# Patient Record
Sex: Female | Born: 1960 | Race: White | Hispanic: No | Marital: Married | Smoking: Current every day smoker
Health system: Southern US, Community
[De-identification: ages and names within clinical notes are randomized; demographics above are authoritative.]

## PROBLEM LIST (undated history)

## (undated) DIAGNOSIS — F419 Anxiety disorder, unspecified: Secondary | ICD-10-CM

## (undated) DIAGNOSIS — M419 Scoliosis, unspecified: Secondary | ICD-10-CM

## (undated) DIAGNOSIS — M1990 Unspecified osteoarthritis, unspecified site: Secondary | ICD-10-CM

## (undated) DIAGNOSIS — I10 Essential (primary) hypertension: Secondary | ICD-10-CM

## (undated) DIAGNOSIS — F4310 Post-traumatic stress disorder, unspecified: Secondary | ICD-10-CM

## (undated) DIAGNOSIS — C801 Malignant (primary) neoplasm, unspecified: Secondary | ICD-10-CM

## (undated) HISTORY — DX: Anxiety disorder, unspecified: F41.9

## (undated) HISTORY — PX: ABDOMINAL HYSTERECTOMY: SHX81

## (undated) HISTORY — DX: Malignant (primary) neoplasm, unspecified: C80.1

---

## 2007-07-02 HISTORY — PX: TOTAL ABDOMINAL HYSTERECTOMY W/ BILATERAL SALPINGOOPHORECTOMY: SHX83

## 2007-07-02 HISTORY — PX: MASTECTOMY: SHX3

## 2007-08-10 DIAGNOSIS — C801 Malignant (primary) neoplasm, unspecified: Secondary | ICD-10-CM

## 2007-08-10 HISTORY — DX: Malignant (primary) neoplasm, unspecified: C80.1

## 2017-12-01 ENCOUNTER — Encounter: Payer: Self-pay | Admitting: Endocrinology

## 2017-12-01 ENCOUNTER — Ambulatory Visit: Payer: Medicaid Other | Admitting: Endocrinology

## 2017-12-01 DIAGNOSIS — E0590 Thyrotoxicosis, unspecified without thyrotoxic crisis or storm: Secondary | ICD-10-CM | POA: Diagnosis not present

## 2017-12-01 NOTE — Progress Notes (Signed)
Subjective:    Patient ID: Jade King, female    DOB: 12-14-1960, 57 y.o.   MRN: [id number]  HPI Pt is referred by Levin Bacon, NP, for hyperthyroidism.  Pt reports he was dx'ed with hyperthyroidism in in early 2019.  she has never been on therapy for this.  she has never had XRT to the anterior neck, or thyroid surgery.  she has never had dedicated thyroid imaging.  she does not consume kelp or any other non-prescribed thyroid medication.  she has never been on amiodarone.  She had moderate anxiety, and assoc insomnia.  Metoprolol is for heart disease, not thyroid.   Past Medical History:  Diagnosis Date  . Anxiety   . Cancer ([place]) 08/10/2007   Breast cancer     Social History   Socioeconomic History  . Marital status: Married    Spouse name: Not on file  . Number of children: Not on file  . Years of education: Not on file  . Highest education level: Not on file  Occupational History  . Not on file  Social Needs  . Financial resource strain: Not on file  . Food insecurity:    Worry: Not on file    Inability: Not on file  . Transportation needs:    Medical: Not on file    Non-medical: Not on file  Tobacco Use  . Smoking status: Current Every Day Smoker    Types: Cigarettes  . Smokeless tobacco: Never Used  Substance and Sexual Activity  . Alcohol use: Never    Frequency: Never  . Drug use: Never  . Sexual activity: Not on file  Lifestyle  . Physical activity:    Days per week: Not on file    Minutes per session: Not on file  . Stress: Not on file  Relationships  . Social connections:    Talks on phone: Not on file    Gets together: Not on file    Attends religious service: Not on file    Active member of club or organization: Not on file    Attends meetings of clubs or organizations: Not on file    Relationship status: Not on file  . Intimate partner violence:    Fear of current or ex partner: Not on file    Emotionally abused: Not on file    Physically  abused: Not on file    Forced sexual activity: Not on file  Other Topics Concern  . Not on file  Social History Narrative  . Not on file    Current Outpatient Medications on File Prior to Visit  Medication Sig Dispense Refill  . metoprolol tartrate (LOPRESSOR) 100 MG tablet Take 100 mg by mouth 2 (two) times daily.    . traMADol (ULTRAM) 50 MG tablet Take 50 mg by mouth every 6 (six) hours as needed.     No current facility-administered medications on file prior to visit.     Allergies  Allergen Reactions  . Latex Hives  . Other Hives    Nuts  . Penicillins Hives and Shortness Of Breath    Family History  Problem Relation Age of Onset  . Thyroid nodules Mother   . Thyroid disease Mother     BP (!) 112/58 (BP Location: Left Arm, Patient Position: Sitting, Cuff Size: Normal)   Pulse 76   Ht 5\' 3"  (1.6 m)   Wt 103 lb (46.7 kg)   SpO2 98%   BMI 18.25 kg/m  Review of Systems denies weight loss, headache, hoarseness, diplopia, palpitations, sob, diarrhea, nocturia, muscle weakness, edema, depression, excessive diaphoresis, tremor, heat intolerance, easy bruising, and rhinorrhea.  She has fatigue.       Objective:   Physical Exam VS: see vs page GEN: no distress HEAD: head: no deformity.  Slight bilat temporal wasting eyes: no periorbital swelling, no proptosis external nose and ears are normal mouth: no lesion seen NECK: supple, thyroid is not enlarged, but has irreg surface.   CHEST WALL: scoliosis is noted LUNGS: clear to auscultation CV: reg rate and rhythm, no murmur ABD: abdomen is soft, nontender.  no hepatosplenomegaly.  not distended.  no hernia MUSCULOSKELETAL: muscle bulk and strength are grossly normal.  no obvious joint swelling.  gait is normal and steady EXTEMITIES: no deformity.  no edema. PULSES: no carotid bruit.   NEURO:  cn 2-12 grossly intact.   readily moves all 4's.  sensation is intact to touch on all 4's.  No tremor.   SKIN:  Normal  texture and temperature.  No rash or suspicious lesion is visible.  Not diaphoretic.  Slight terminal hair on the face NODES:  None palpable at the neck PSYCH: alert, well-oriented.  Does not appear anxious nor depressed.    TSH=0.01  I have reviewed outside records, and summarized: Pt was noted to have hyperthyroidism, and referred here.  Main problems addressed were back pain and anxiety.  TFT were done to eval anxiety.    Lab Results  Component Value Date   TSH 0.13 (L) 12/01/2017      Assessment & Plan:  Hyperthyroidism: new, uncertain etiology. Hirsutism: we discussed.  Pt wants to exclude cushing's. I told pt we'll have to address this when thyroid is better.   Patient Instructions  blood tests are requested for you today.  We'll let you know about the results. If it is high, I'll prescribe for you a pill to slow it down. If ever you have fever while taking methimazole, stop it and call us, even if the reason is obvious, because of the risk of a rare side-effect. Please come back for a follow-up appointment in 1 month.

## 2017-12-01 NOTE — Patient Instructions (Signed)
blood tests are requested for you today.  We'll let you know about the results. If it is high, I'll prescribe for you a pill to slow it down. If ever you have fever while taking methimazole, stop it and call us, even if the reason is obvious, because of the risk of a rare side-effect. Please come back for a follow-up appointment in 1 month.

## 2017-12-02 LAB — TSH: TSH: 0.13 u[IU]/mL — AB (ref 0.35–4.50)

## 2017-12-02 LAB — T4, FREE: Free T4: 0.83 ng/dL (ref 0.60–1.60)

## 2017-12-02 MED ORDER — METHIMAZOLE 10 MG PO TABS: 10.0000 mg | ORAL_TABLET | Freq: Three times a day (TID) | ORAL | 2 refills | Status: DC

## 2017-12-03 ENCOUNTER — Other Ambulatory Visit: Payer: Self-pay

## 2017-12-05 ENCOUNTER — Telehealth: Payer: Self-pay | Admitting: Endocrinology

## 2017-12-05 ENCOUNTER — Other Ambulatory Visit: Payer: Self-pay

## 2017-12-05 NOTE — Telephone Encounter (Signed)
methimazole (TAPAZOLE) 10 MG tablet   Patient has some questions about new medication that she is taking. And would like someone to give her a call back

## 2017-12-05 NOTE — Telephone Encounter (Signed)
I called patient & she was concerned that due to what time she gets up in the morning she would not be able to take medication 8 hrs apart. I advised that it does not say specifically 8 hrs between doses.

## 2017-12-12 ENCOUNTER — Telehealth: Payer: Self-pay | Admitting: Endocrinology

## 2017-12-12 ENCOUNTER — Other Ambulatory Visit: Payer: Self-pay

## 2017-12-12 MED ORDER — METHIMAZOLE 10 MG PO TABS: 10.0000 mg | ORAL_TABLET | Freq: Three times a day (TID) | ORAL | 2 refills | Status: DC

## 2017-12-12 NOTE — Telephone Encounter (Signed)
Please call in the Methimazole to [facility] in [place] with the correct quantity needs more than 30 tabs to take it 1 tab 3 times daily

## 2017-12-12 NOTE — Telephone Encounter (Signed)
I have sent 90 tablets in for patient.

## 2018-01-28 ENCOUNTER — Encounter: Payer: Self-pay | Admitting: Endocrinology

## 2018-01-28 ENCOUNTER — Ambulatory Visit: Payer: Medicaid Other | Admitting: Endocrinology

## 2018-01-28 VITALS — BP 122/80 | HR 72 | Temp 98.2°F | Ht 63.0 in | Wt 103.0 lb

## 2018-01-28 DIAGNOSIS — L680 Hirsutism: Secondary | ICD-10-CM

## 2018-01-28 DIAGNOSIS — E0590 Thyrotoxicosis, unspecified without thyrotoxic crisis or storm: Secondary | ICD-10-CM | POA: Diagnosis not present

## 2018-01-28 LAB — CORTISOL: CORTISOL PLASMA: 11.6 ug/dL

## 2018-01-28 LAB — TSH: TSH: 1.01 u[IU]/mL (ref 0.35–4.50)

## 2018-01-28 LAB — T4, FREE: FREE T4: 0.81 ng/dL (ref 0.60–1.60)

## 2018-01-28 MED ORDER — METHIMAZOLE 5 MG PO TABS: 5.0000 mg | ORAL_TABLET | Freq: Every day | ORAL | 5 refills | Status: DC

## 2018-01-28 NOTE — Progress Notes (Signed)
Subjective:    Patient ID: Jade King, female    DOB: [birth year], 57 y.o.   MRN: [id number]  HPI Pt returns for f/u of mild hyperthyroidism (dx'ed early 2019; she has never had dedicated thyroid imaging; metoprolol is for heart disease, not thyroid; she chose tapazole rx).  She takes tapazole 10 mg BID.  pt states she feels well in general, except for anxiety.  She has moderate terminal hair growth on the face, but no assoc flushing Past Medical History:  Diagnosis Date  . Anxiety   . Cancer ([place]) 08/10/2007   Breast cancer     Social History   Socioeconomic History  . Marital status: Married    Spouse name: Not on file  . Number of children: Not on file  . Years of education: Not on file  . Highest education level: Not on file  Occupational History  . Not on file  Social Needs  . Financial resource strain: Not on file  . Food insecurity:    Worry: Not on file    Inability: Not on file  . Transportation needs:    Medical: Not on file    Non-medical: Not on file  Tobacco Use  . Smoking status: Current Every Day Smoker    Types: Cigarettes  . Smokeless tobacco: Never Used  Substance and Sexual Activity  . Alcohol use: Never    Frequency: Never  . Drug use: Never  . Sexual activity: Not on file  Lifestyle  . Physical activity:    Days per week: Not on file    Minutes per session: Not on file  . Stress: Not on file  Relationships  . Social connections:    Talks on phone: Not on file    Gets together: Not on file    Attends religious service: Not on file    Active member of club or organization: Not on file    Attends meetings of clubs or organizations: Not on file    Relationship status: Not on file  . Intimate partner violence:    Fear of current or ex partner: Not on file    Emotionally abused: Not on file    Physically abused: Not on file    Forced sexual activity: Not on file  Other Topics Concern  . Not on file  Social History Narrative  . Not on  file    Current Outpatient Medications on File Prior to Visit  Medication Sig Dispense Refill  . metoprolol tartrate (LOPRESSOR) 100 MG tablet Take 100 mg by mouth 2 (two) times daily.    . RESTASIS 0.05 % ophthalmic emulsion   3  . traMADol (ULTRAM) 50 MG tablet Take 50 mg by mouth every 6 (six) hours as needed.     No current facility-administered medications on file prior to visit.     Allergies  Allergen Reactions  . Latex Hives  . Other Hives    Nuts  . Penicillins Hives and Shortness Of Breath    Family History  Problem Relation Age of Onset  . Thyroid nodules Mother   . Thyroid disease Mother     BP 122/80 (BP Location: Right Arm, Patient Position: Sitting, Cuff Size: Normal)   Pulse 72   Temp 98.2 F (36.8 C) (Oral)   Ht 5\' 3"  (1.6 m)   Wt 103 lb (46.7 kg)   SpO2 97%   BMI 18.25 kg/m   Review of Systems Denies fever and palpitations    Objective:  Physical Exam VITAL SIGNS:  See vs page GENERAL: no distress NECK: There is no palpable thyroid enlargement.  No thyroid nodule is palpable.  No palpable lymphadenopathy at the anterior neck.     Lab Results  Component Value Date   TSH 1.01 01/28/2018      Assessment & Plan:  Hyperthyroidism: due for recheck.  Tachycardia: not thyroid-related: well-controlled.  Please continue the same metoprolol.   Patient Instructions  blood tests are requested for you today.  We'll let you know about the results. Based on the result, we'll reduce the methimazole. If ever you have fever while taking methimazole, stop it and call us, even if the reason is obvious, because of the risk of a rare side-effect.  Please come back for a follow-up appointment in 2 months.

## 2018-01-28 NOTE — Patient Instructions (Addendum)
blood tests are requested for you today.  We'll let you know about the results. Based on the result, we'll reduce the methimazole. If ever you have fever while taking methimazole, stop it and call us, even if the reason is obvious, because of the risk of a rare side-effect.  Please come back for a follow-up appointment in 2 months.

## 2018-01-30 LAB — TESTOSTERONE,FREE AND TOTAL
Testosterone, Free: 0.7 pg/mL (ref 0.0–4.2)
Testosterone: 9 ng/dL (ref 3–41)

## 2018-03-31 ENCOUNTER — Ambulatory Visit: Payer: Medicaid Other | Admitting: Endocrinology

## 2018-05-05 ENCOUNTER — Encounter: Payer: Self-pay | Admitting: Endocrinology

## 2018-05-05 ENCOUNTER — Ambulatory Visit (INDEPENDENT_AMBULATORY_CARE_PROVIDER_SITE_OTHER): Payer: Medicaid Other | Admitting: Endocrinology

## 2018-05-05 VITALS — BP 124/78 | HR 74 | Ht 63.0 in | Wt 105.3 lb

## 2018-05-05 DIAGNOSIS — L680 Hirsutism: Secondary | ICD-10-CM

## 2018-05-05 DIAGNOSIS — E0590 Thyrotoxicosis, unspecified without thyrotoxic crisis or storm: Secondary | ICD-10-CM

## 2018-05-05 NOTE — Patient Instructions (Addendum)
blood tests are requested for you today.  We'll let you know about the results. If ever you have fever while taking methimazole, stop it and call us, even if the reason is obvious, because of the risk of a rare side-effect. Please come back for a follow-up appointment in 4 months.  

## 2018-05-05 NOTE — Progress Notes (Signed)
Subjective:    Patient ID: Jade King, female    DOB: 1961/01/22, 57 y.o.   MRN: [id number]  HPI Pt returns for f/u of mild hyperthyroidism (dx'ed early 2019; she has never had dedicated thyroid imaging; metoprolol is for heart disease, not thyroid; she chose tapazole rx).  She takes tapazole 10 mg BID.  pt states she feels well in general.   Past Medical History:  Diagnosis Date  . Anxiety   . Cancer ([place]) 08/10/2007   Breast cancer     Social History   Socioeconomic History  . Marital status: Married    Spouse name: Not on file  . Number of children: Not on file  . Years of education: Not on file  . Highest education level: Not on file  Occupational History  . Not on file  Social Needs  . Financial resource strain: Not on file  . Food insecurity:    Worry: Not on file    Inability: Not on file  . Transportation needs:    Medical: Not on file    Non-medical: Not on file  Tobacco Use  . Smoking status: Current Every Day Smoker    Types: Cigarettes  . Smokeless tobacco: Never Used  Substance and Sexual Activity  . Alcohol use: Never    Frequency: Never  . Drug use: Never  . Sexual activity: Not on file  Lifestyle  . Physical activity:    Days per week: Not on file    Minutes per session: Not on file  . Stress: Not on file  Relationships  . Social connections:    Talks on phone: Not on file    Gets together: Not on file    Attends religious service: Not on file    Active member of club or organization: Not on file    Attends meetings of clubs or organizations: Not on file    Relationship status: Not on file  . Intimate partner violence:    Fear of current or ex partner: Not on file    Emotionally abused: Not on file    Physically abused: Not on file    Forced sexual activity: Not on file  Other Topics Concern  . Not on file  Social History Narrative  . Not on file    Current Outpatient Medications on File Prior to Visit  Medication Sig Dispense  Refill  . methimazole (TAPAZOLE) 5 MG tablet Take 1 tablet (5 mg total) by mouth daily. 30 tablet 5  . metoprolol tartrate (LOPRESSOR) 100 MG tablet Take 100 mg by mouth 2 (two) times daily.    . RESTASIS 0.05 % ophthalmic emulsion   3  . traMADol (ULTRAM) 50 MG tablet Take 50 mg by mouth every 6 (six) hours as needed.     No current facility-administered medications on file prior to visit.     Allergies  Allergen Reactions  . Latex Hives  . Other Hives    Nuts  . Penicillins Hives and Shortness Of Breath    Family History  Problem Relation Age of Onset  . Thyroid nodules Mother   . Thyroid disease Mother     BP 124/78 (BP Location: Right Arm, Patient Position: Sitting, Cuff Size: Normal)   Pulse 74   Ht 5\' 3"  (1.6 m)   Wt 105 lb 4.8 oz (47.8 kg)   SpO2 96%   BMI 18.65 kg/m    Review of Systems Denies fever.  Facial hair persists.  Objective:   Physical Exam VITAL SIGNS:  See vs page GENERAL: no distress NECK: There is no palpable thyroid enlargement.  No thyroid nodule is palpable.  No palpable lymphadenopathy at the anterior neck.    Lab Results  Component Value Date   TSH 2.36 05/05/2018       Assessment & Plan:  Hyperthyroidism: well-controlled. Hirsutism, mild.  I told pt no w/u is needed  Patient Instructions  blood tests are requested for you today.  We'll let you know about the results. If ever you have fever while taking methimazole, stop it and call us, even if the reason is obvious, because of the risk of a rare side-effect.  Please come back for a follow-up appointment in 4 months.

## 2018-05-06 LAB — DHEA-SULFATE: DHEA-SO4: 58 ug/dL (ref 8–188)

## 2018-05-06 LAB — T4, FREE: FREE T4: 0.73 ng/dL (ref 0.60–1.60)

## 2018-05-06 LAB — TSH: TSH: 2.36 u[IU]/mL (ref 0.35–4.50)

## 2018-07-12 ENCOUNTER — Other Ambulatory Visit: Payer: Self-pay | Admitting: Endocrinology

## 2018-09-01 ENCOUNTER — Ambulatory Visit: Payer: Medicaid Other | Admitting: Endocrinology

## 2018-09-01 ENCOUNTER — Encounter: Payer: Self-pay | Admitting: Endocrinology

## 2018-09-01 ENCOUNTER — Other Ambulatory Visit: Payer: Self-pay

## 2018-09-01 VITALS — BP 140/90 | HR 68 | Ht 63.0 in | Wt 109.0 lb

## 2018-09-01 DIAGNOSIS — E0590 Thyrotoxicosis, unspecified without thyrotoxic crisis or storm: Secondary | ICD-10-CM

## 2018-09-01 DIAGNOSIS — I10 Essential (primary) hypertension: Secondary | ICD-10-CM | POA: Diagnosis not present

## 2018-09-01 NOTE — Patient Instructions (Addendum)
Your blood pressure is high today.  Please see your primary care provider soon, to have it rechecked. blood tests are requested for you today.  We'll let you know about the results. If ever you have fever while taking methimazole, stop it and call us, even if the reason is obvious, because of the risk of a rare side-effect.  Please come back for a follow-up appointment in 6 months.

## 2018-09-01 NOTE — Progress Notes (Signed)
Subjective:    Patient ID: Jade King, female    DOB: 1960/07/06, 58 y.o.   MRN: [id number]  HPI Pt returns for f/u of mild hyperthyroidism (dx'ed early 2019; she has never had dedicated thyroid imaging; metoprolol is for heart disease, not thyroid; she chose tapazole rx).  pt states she feels well in general, except for anxiety.    Past Medical History:  Diagnosis Date  . Anxiety   . Cancer ([place]) 08/10/2007   Breast cancer     Social History   Socioeconomic History  . Marital status: Married    Spouse name: Not on file  . Number of children: Not on file  . Years of education: Not on file  . Highest education level: Not on file  Occupational History  . Not on file  Social Needs  . Financial resource strain: Not on file  . Food insecurity:    Worry: Not on file    Inability: Not on file  . Transportation needs:    Medical: Not on file    Non-medical: Not on file  Tobacco Use  . Smoking status: Current Every Day Smoker    Types: Cigarettes  . Smokeless tobacco: Never Used  Substance and Sexual Activity  . Alcohol use: Never    Frequency: Never  . Drug use: Never  . Sexual activity: Not on file  Lifestyle  . Physical activity:    Days per week: Not on file    Minutes per session: Not on file  . Stress: Not on file  Relationships  . Social connections:    Talks on phone: Not on file    Gets together: Not on file    Attends religious service: Not on file    Active member of club or organization: Not on file    Attends meetings of clubs or organizations: Not on file    Relationship status: Not on file  . Intimate partner violence:    Fear of current or ex partner: Not on file    Emotionally abused: Not on file    Physically abused: Not on file    Forced sexual activity: Not on file  Other Topics Concern  . Not on file  Social History Narrative  . Not on file    Current Outpatient Medications on File Prior to Visit  Medication Sig Dispense Refill  .  methimazole (TAPAZOLE) 5 MG tablet TAKE 1 TABLET(5 MG) BY MOUTH DAILY 30 tablet 5  . metoprolol tartrate (LOPRESSOR) 100 MG tablet Take 100 mg by mouth 2 (two) times daily.    . RESTASIS 0.05 % ophthalmic emulsion   3  . traMADol (ULTRAM) 50 MG tablet Take 50 mg by mouth every 6 (six) hours as needed.     No current facility-administered medications on file prior to visit.     Allergies  Allergen Reactions  . Latex Hives  . Other Hives    Nuts  . Penicillins Hives and Shortness Of Breath    Family History  Problem Relation Age of Onset  . Thyroid nodules Mother   . Thyroid disease Mother     BP 140/90   Pulse 68   Ht 5\' 3"  (1.6 m)   Wt 109 lb (49.4 kg)   SpO2 98%   BMI 19.31 kg/m    Review of Systems Denies fever.     Objective:   Physical Exam VITAL SIGNS:  See vs page GENERAL: no distress NECK: There is no palpable thyroid enlargement.  No thyroid nodule is palpable.  No palpable lymphadenopathy at the anterior neck.   Lab Results  Component Value Date   TSH 2.34 09/01/2018      Assessment & Plan:  HTN: is noted today Hyperthyroidism: well-controlled. Please continue the same medication.   Patient Instructions  Your blood pressure is high today.  Please see your primary care provider soon, to have it rechecked. blood tests are requested for you today.  We'll let you know about the results. If ever you have fever while taking methimazole, stop it and call us, even if the reason is obvious, because of the risk of a rare side-effect.  Please come back for a follow-up appointment in 6 months.

## 2018-09-02 LAB — T4, FREE: FREE T4: 0.73 ng/dL (ref 0.60–1.60)

## 2018-09-02 LAB — TSH: TSH: 2.34 u[IU]/mL (ref 0.35–4.50)

## 2019-01-04 ENCOUNTER — Telehealth: Payer: Self-pay | Admitting: Endocrinology

## 2019-01-04 ENCOUNTER — Other Ambulatory Visit: Payer: Self-pay

## 2019-01-04 MED ORDER — METHIMAZOLE 5 MG PO TABS: ORAL_TABLET | ORAL | 5 refills | Status: AC

## 2019-01-04 NOTE — Telephone Encounter (Signed)
MEDICATION: methimazole (TAPAZOLE) 5 MG tablet  PHARMACY:  [id number]  IS THIS A 90 DAY SUPPLY :   IS PATIENT OUT OF MEDICATION:   IF NOT; HOW MUCH IS LEFT:   LAST APPOINTMENT DATE: @3 /08/2018  NEXT APPOINTMENT DATE:@9 /03/2019  DO WE HAVE YOUR PERMISSION TO LEAVE A DETAILED MESSAGE:  OTHER COMMENTS:    **Let patient know to contact pharmacy at the end of the day to make sure medication is ready. **  ** Please notify patient to allow 48-72 hours to process**  **Encourage patient to contact the pharmacy for refills or they can request refills through [facility]**

## 2019-01-04 NOTE — Telephone Encounter (Signed)
methimazole (TAPAZOLE) 5 MG tablet 30 tablet 5 01/04/2019    Sig: TAKE 1 TABLET(5 MG) BY MOUTH DAILY   Sent to pharmacy as: methimazole (TAPAZOLE) 5 MG tablet   E-Prescribing Status: Receipt confirmed by pharmacy (01/04/2019 12:43 PM EDT)

## 2019-03-10 ENCOUNTER — Ambulatory Visit: Payer: Medicaid Other | Admitting: Endocrinology

## 2019-03-17 ENCOUNTER — Telehealth: Payer: Self-pay | Admitting: Endocrinology

## 2019-03-17 NOTE — Telephone Encounter (Signed)
Recv'd incoming medical records from Dr. Izora Ribas [facility]. Forwarded 35 pages to Dr. Renato Shin 9/16/20fbg.

## 2019-03-19 ENCOUNTER — Telehealth: Payer: Self-pay | Admitting: Endocrinology

## 2019-03-19 NOTE — Telephone Encounter (Signed)
please contact patient: F/u ov is due 

## 2019-03-19 NOTE — Telephone Encounter (Signed)
FYI

## 2019-03-19 NOTE — Telephone Encounter (Signed)
Please refer to Dr. Ellison's request below 

## 2019-03-19 NOTE — Telephone Encounter (Signed)
Patient states she will not be seeing Dr. Loanne Drilling anymore. Patient is seeing a new Endocrinologist.

## 2019-03-19 NOTE — Telephone Encounter (Signed)
LMTCB to schedule appointment °

## 2020-02-28 ENCOUNTER — Ambulatory Visit
Admission: EM | Admit: 2020-02-28 | Discharge: 2020-02-28 | Disposition: A | Discharge status: Home / Self Care | Payer: Medicaid Other | Attending: Emergency Medicine | Admitting: Emergency Medicine

## 2020-02-28 ENCOUNTER — Other Ambulatory Visit: Payer: Self-pay

## 2020-02-28 DIAGNOSIS — R21 Rash and other nonspecific skin eruption: Secondary | ICD-10-CM | POA: Diagnosis not present

## 2020-02-28 HISTORY — DX: Post-traumatic stress disorder, unspecified: F43.10

## 2020-02-28 HISTORY — DX: Essential (primary) hypertension: I10

## 2020-02-28 HISTORY — DX: Unspecified osteoarthritis, unspecified site: M19.90

## 2020-02-28 HISTORY — DX: Scoliosis, unspecified: M41.9

## 2020-02-28 MED ORDER — TRIAMCINOLONE ACETONIDE 0.1 % EX CREA: 1.0000 "application " | TOPICAL_CREAM | Freq: Two times a day (BID) | CUTANEOUS | 0 refills | Status: AC

## 2020-02-28 MED ORDER — VALACYCLOVIR HCL 1 G PO TABS: 1000.0000 mg | ORAL_TABLET | Freq: Three times a day (TID) | ORAL | 0 refills | Status: AC

## 2020-02-28 NOTE — ED Triage Notes (Signed)
Pt c/o an itchy rash to back, lt leg, and chest with nausea and cough x2wks. States had a neg covid test after sx's started.

## 2020-02-28 NOTE — Discharge Instructions (Signed)
Begin Valtrex 3 times a day for 10 days Triamcinolone to areas of itching/inflammation twice daily May use daily cetirizine/Zyrtec or loratadine/Claritin for the help with itching Continue to monitor rash and follow-up if not improving or worsening

## 2020-02-28 NOTE — ED Provider Notes (Signed)
[facility]    CSN: [id number] Arrival date & time: 02/28/20  1302      History   Chief Complaint Chief Complaint  Patient presents with  . Rash    HPI Jade King is a 59 y.o. female history of hypertension, breast cancer, scoliosis, presenting today for evaluation of a rash.  Patient reports over the past 2 to 3 weeks she has had a rash to her back.  Most prominently on the right side.  Has had associated sensitivity itching burning and discomfort with rash.  Over the past couple days she has began to feel similar symptoms to her abdomen as well as lower legs bilaterally.  She is concerned about possible shingles.  Denies any new foods, soaps, lotions, detergents, medicines, hygiene products.  HPI  Past Medical History:  Diagnosis Date  . Anxiety   . Arthritis   . Cancer ([place]) 08/10/2007   Breast cancer  . Hypertension   . PTSD (post-traumatic stress disorder)   . Scoliosis     Patient Active Problem List   Diagnosis Date Noted  . Hirsutism 01/28/2018  . Hyperthyroidism 12/01/2017    Past Surgical History:  Procedure Laterality Date  . ABDOMINAL HYSTERECTOMY    . MASTECTOMY  2009  . TOTAL ABDOMINAL HYSTERECTOMY W/ BILATERAL SALPINGOOPHORECTOMY  2009    OB History   No obstetric history on file.      Home Medications    Prior to Admission medications   Medication Sig Start Date End Date Taking? Authorizing Provider  methimazole (TAPAZOLE) 5 MG tablet TAKE 1 TABLET(5 MG) BY MOUTH DAILY 01/04/19   Renato Shin, MD  metoprolol tartrate (LOPRESSOR) 100 MG tablet Take 100 mg by mouth 2 (two) times daily.    [provider]  RESTASIS 0.05 % ophthalmic emulsion  11/01/17   [provider]  triamcinolone cream (KENALOG) 0.1 % Apply 1 application topically 2 (two) times daily. 02/28/20   Brexton Sofia C, PA-C  valACYclovir (VALTREX) 1000 MG tablet Take 1 tablet (1,000 mg total) by mouth 3 (three) times daily for 10 days. 02/28/20  03/09/20  Lonna Rabold, Elesa Hacker, PA-C    Family History Family History  Problem Relation Age of Onset  . Thyroid nodules Mother   . Thyroid disease Mother     Social History Social History   Tobacco Use  . Smoking status: Current Every Day Smoker    Types: Cigarettes  . Smokeless tobacco: Never Used  Substance Use Topics  . Alcohol use: Never  . Drug use: Never     Allergies   Latex, Other, and Penicillins   Review of Systems Review of Systems  Constitutional: Negative for fatigue and fever.  HENT: Negative for mouth sores.   Eyes: Negative for visual disturbance.  Respiratory: Negative for shortness of breath.   Cardiovascular: Negative for chest pain.  Gastrointestinal: Negative for abdominal pain, nausea and vomiting.  Musculoskeletal: Negative for arthralgias and joint swelling.  Skin: Positive for color change and rash. Negative for wound.  Neurological: Negative for dizziness, weakness, light-headedness and headaches.     Physical Exam Triage Vital Signs ED Triage Vitals [02/28/20 1447]  Enc Vitals Group     BP (!) 160/81     Pulse Rate 72     Resp 18     Temp 98.5 F (36.9 C)     Temp Source Oral     SpO2 97 %     Weight      Height  Head Circumference      Peak Flow      Pain Score 10     Pain Loc      Pain Edu?      Excl. in [place]?    No data found.  Updated Vital Signs BP (!) 160/81 (BP Location: Left Arm)   Pulse 72   Temp 98.5 F (36.9 C) (Oral)   Resp 18   SpO2 97%   Visual Acuity Right Eye Distance:   Left Eye Distance:   Bilateral Distance:    Right Eye Near:   Left Eye Near:    Bilateral Near:     Physical Exam Vitals and nursing note reviewed.  Constitutional:      Appearance: She is well-developed.     Comments: No acute distress  HENT:     Head: Normocephalic and atraumatic.     Nose: Nose normal.     Mouth/Throat:     Comments: No lesions noted on oral mucosa Eyes:     Conjunctiva/sclera: Conjunctivae normal.    Cardiovascular:     Rate and Rhythm: Normal rate.  Pulmonary:     Effort: Pulmonary effort is normal. No respiratory distress.  Abdominal:     General: There is no distension.  Musculoskeletal:        General: Normal range of motion.     Cervical back: Neck supple.  Skin:    General: Skin is warm and dry.     Comments: Back with area of clustered hyperpigmented and circular areas most prominently noted centrally over lower thoracic spine spreading towards right lower thoracic area, no lesions noted rocking to abdomen or spreading towards left  Left leg with small erythematous nonblanchable punctate lesions, approximately 3, noted to medial aspect of left knee  No other rash noted  Neurological:     Mental Status: She is alert and oriented to person, place, and time.      UC Treatments / Results  Labs (all labs ordered are listed, but only abnormal results are displayed) Labs Reviewed - No data to display  EKG   Radiology No results found.  Procedures Procedures (including critical care time)  Medications Ordered in UC Medications - No data to display  Initial Impression / Assessment and Plan / UC Course  I have reviewed the triage vital signs and the nursing notes.  Pertinent labs & imaging results that were available during my care of the patient were reviewed by me and considered in my medical decision making (see chart for details).     Healing rash on back is most suggestive of shingles given description as well as appearance on back, discussed with patient lesions appear healed, possible remaining neuralgia secondary to shingles.  Also concerned about spreading given she has felt some itching and burning sensations in her legs and abdomen.  Did go ahead and after provide course of Valtrex, but encouraged to continue to monitor rash.  Lesions on leg less suggestive of shingles.  Triamcinolone topically as needed.  May use oral antihistamines for further itch  relief.  Offered hydroxyzine, patient declined as she reports change in 92-month old dog which requires assistance.  Discussed strict return precautions. Patient verbalized understanding and is agreeable with plan.  Final Clinical Impressions(s) / UC Diagnoses   Final diagnoses:  Rash and nonspecific skin eruption     Discharge Instructions     Begin Valtrex 3 times a day for 10 days Triamcinolone to areas of itching/inflammation twice daily May  use daily cetirizine/Zyrtec or loratadine/Claritin for the help with itching Continue to monitor rash and follow-up if not improving or worsening    ED Prescriptions    Medication Sig Dispense Auth. Provider   valACYclovir (VALTREX) 1000 MG tablet Take 1 tablet (1,000 mg total) by mouth 3 (three) times daily for 10 days. 30 tablet Keysean Savino C, PA-C   triamcinolone cream (KENALOG) 0.1 % Apply 1 application topically 2 (two) times daily. 45 g Leily Capek, [place] C, PA-C     PDMP not reviewed this encounter.   Joneen Caraway [place] C, [place] 02/29/20 0740

## 2020-08-15 ENCOUNTER — Ambulatory Visit
Admission: EM | Admit: 2020-08-15 | Discharge: 2020-08-15 | Disposition: A | Discharge status: Home / Self Care | Payer: Medicaid Other | Attending: Emergency Medicine | Admitting: Emergency Medicine

## 2020-08-15 ENCOUNTER — Other Ambulatory Visit: Payer: Self-pay

## 2020-08-15 ENCOUNTER — Encounter: Payer: Self-pay | Admitting: Emergency Medicine

## 2020-08-15 DIAGNOSIS — R5383 Other fatigue: Secondary | ICD-10-CM | POA: Diagnosis not present

## 2020-08-15 DIAGNOSIS — R52 Pain, unspecified: Secondary | ICD-10-CM | POA: Diagnosis not present

## 2020-08-15 DIAGNOSIS — R21 Rash and other nonspecific skin eruption: Secondary | ICD-10-CM

## 2020-08-15 MED ORDER — VALACYCLOVIR HCL 1 G PO TABS: 1000.0000 mg | ORAL_TABLET | Freq: Three times a day (TID) | ORAL | 0 refills | Status: AC

## 2020-08-15 NOTE — Discharge Instructions (Addendum)
COVID test pending Begin valtrex, monitor for development of more prominent shingles rash in the next few days Rest and fluids Follow up if symptoms not improving/changing/worsening

## 2020-08-15 NOTE — ED Triage Notes (Signed)
Pt here for bilateral rash to chest area starting today with some "different aches" than normal

## 2020-08-16 NOTE — ED Provider Notes (Signed)
[facility]    CSN: [id number] Arrival date & time: 08/15/20  1808      History   Chief Complaint Chief Complaint  Patient presents with  . Rash    HPI Jade King is a 60 y.o. female history of breast cancer, scoliosis, hypertension, presenting today for evaluation of possible rash.  Patient reports over the past 24 hours she has developed fatigue aches, intermittent low-grade fevers as well as concern for possible rash to trunk.  She felt that her back and sides have felt slightly different than normal and is concerned possible rash popping up.  She reports that her daughter who lives at home with her recently was diagnosed with shingles and is concerned about this being the cause.  She does describe a burning/tingling sensation that began over the past day.  She denies any URI symptoms or cough.  Denies any nausea or vomiting.  HPI  Past Medical History:  Diagnosis Date  . Anxiety   . Arthritis   . Cancer ([place]) 08/10/2007   Breast cancer  . Hypertension   . PTSD (post-traumatic stress disorder)   . Scoliosis     Patient Active Problem List   Diagnosis Date Noted  . Hirsutism 01/28/2018  . Hyperthyroidism 12/01/2017    Past Surgical History:  Procedure Laterality Date  . ABDOMINAL HYSTERECTOMY    . MASTECTOMY  2009  . TOTAL ABDOMINAL HYSTERECTOMY W/ BILATERAL SALPINGOOPHORECTOMY  2009    OB History   No obstetric history on file.      Home Medications    Prior to Admission medications   Medication Sig Start Date End Date Taking? Authorizing Provider  valACYclovir (VALTREX) 1000 MG tablet Take 1 tablet (1,000 mg total) by mouth 3 (three) times daily for 14 days. 08/15/20 08/29/20 Yes Lijah Bourque C, PA-C  methimazole (TAPAZOLE) 5 MG tablet TAKE 1 TABLET(5 MG) BY MOUTH DAILY 01/04/19   Renato Shin, MD  metoprolol tartrate (LOPRESSOR) 100 MG tablet Take 100 mg by mouth 2 (two) times daily.    [provider]  RESTASIS 0.05 %  ophthalmic emulsion  11/01/17   [provider]  triamcinolone cream (KENALOG) 0.1 % Apply 1 application topically 2 (two) times daily. 02/28/20   Aquilla Shambley, Elesa Hacker, PA-C    Family History Family History  Problem Relation Age of Onset  . Thyroid nodules Mother   . Thyroid disease Mother     Social History Social History   Tobacco Use  . Smoking status: Current Every Day Smoker    Types: Cigarettes  . Smokeless tobacco: Never Used  Substance Use Topics  . Alcohol use: Never  . Drug use: Never     Allergies   Latex, Other, and Penicillins   Review of Systems Review of Systems  Constitutional: Positive for fatigue. Negative for fever.  HENT: Negative for congestion and sore throat.   Eyes: Negative for visual disturbance.  Respiratory: Negative for cough and shortness of breath.   Cardiovascular: Negative for chest pain.  Gastrointestinal: Negative for abdominal pain, nausea and vomiting.  Musculoskeletal: Positive for arthralgias. Negative for joint swelling.  Skin: Positive for color change and rash. Negative for wound.  Neurological: Negative for dizziness, weakness, light-headedness and headaches.     Physical Exam Triage Vital Signs ED Triage Vitals [08/15/20 1955]  Enc Vitals Group     BP (!) 159/99     Pulse Rate 72     Resp 18     Temp 98.2 F (  36.8 C)     Temp Source Oral     SpO2 98 %     Weight      Height      Head Circumference      Peak Flow      Pain Score 8     Pain Loc      Pain Edu?      Excl. in [place]?    No data found.  Updated Vital Signs BP (!) 159/99 (BP Location: Right Arm)   Pulse 72   Temp 98.2 F (36.8 C) (Oral)   Resp 18   SpO2 98%   Visual Acuity Right Eye Distance:   Left Eye Distance:   Bilateral Distance:    Right Eye Near:   Left Eye Near:    Bilateral Near:     Physical Exam Vitals and nursing note reviewed.  Constitutional:      Appearance: She is well-developed and well-nourished.     Comments: No  acute distress  HENT:     Head: Normocephalic and atraumatic.     Ears:     Comments: Bilateral ears without tenderness to palpation of external auricle, tragus and mastoid, EAC's without erythema or swelling, TM's with good bony landmarks and cone of light. Non erythematous.     Nose: Nose normal.     Mouth/Throat:     Comments: Oral mucosa pink and moist, no tonsillar enlargement or exudate. Posterior pharynx patent and nonerythematous, no uvula deviation or swelling. Normal phonation. Eyes:     Conjunctiva/sclera: Conjunctivae normal.  Cardiovascular:     Rate and Rhythm: Normal rate and regular rhythm.  Pulmonary:     Effort: Pulmonary effort is normal. No respiratory distress.     Comments: Breathing comfortably at rest, CTABL, no wheezing, rales or other adventitious sounds auscultated  Abdominal:     General: There is no distension.  Musculoskeletal:        General: Normal range of motion.     Cervical back: Neck supple.  Skin:    General: Skin is warm and dry.     Comments: Trunk with multiple hyperpigmented and very faintly erythematous lesions, no vesicular, raised or significantly prominent lesions noted to chest, abdomen or back  Neurological:     Mental Status: She is alert and oriented to person, place, and time.  Psychiatric:        Mood and Affect: Mood and affect normal.      UC Treatments / Results  Labs (all labs ordered are listed, but only abnormal results are displayed) Labs Reviewed  NOVEL CORONAVIRUS, NAA    EKG   Radiology No results found.  Procedures Procedures (including critical care time)  Medications Ordered in UC Medications - No data to display  Initial Impression / Assessment and Plan / UC Course  I have reviewed the triage vital signs and the nursing notes.  Pertinent labs & imaging results that were available during my care of the patient were reviewed by me and considered in my medical decision making (see chart for  details).     Covid screening pending given patient's onset of fatigue aches and subjective fevers.  No obvious rash noted, but given patient's exposure at home and description of possible prodrome of shingles opting to go ahead and initiate on Valtrex and continuing to monitor.  Discussed strict return precautions. Patient verbalized understanding and is agreeable with plan.  Final Clinical Impressions(s) / UC Diagnoses   Final diagnoses:  Rash and  nonspecific skin eruption  Generalized body aches  Fatigue, unspecified type     Discharge Instructions     COVID test pending Begin valtrex, monitor for development of more prominent shingles rash in the next few days Rest and fluids Follow up if symptoms not improving/changing/worsening   ED Prescriptions    Medication Sig Dispense Auth. Provider   valACYclovir (VALTREX) 1000 MG tablet Take 1 tablet (1,000 mg total) by mouth 3 (three) times daily for 14 days. 42 tablet Sawyer Mentzer, [place] C, PA-C     PDMP not reviewed this encounter.   Janith Lima, [place] 08/16/20 [phone]

## 2020-08-17 LAB — NOVEL CORONAVIRUS, NAA: SARS-CoV-2, NAA: NOT DETECTED

## 2020-08-17 LAB — SARS-COV-2, NAA 2 DAY TAT

## 2020-10-05 ENCOUNTER — Other Ambulatory Visit: Payer: Self-pay

## 2020-10-05 ENCOUNTER — Ambulatory Visit (INDEPENDENT_AMBULATORY_CARE_PROVIDER_SITE_OTHER): Payer: Medicaid Other

## 2020-10-05 ENCOUNTER — Ambulatory Visit
Admission: EM | Admit: 2020-10-05 | Discharge: 2020-10-05 | Disposition: A | Discharge status: Home / Self Care | Payer: Medicaid Other | Attending: Emergency Medicine | Admitting: Emergency Medicine

## 2020-10-05 DIAGNOSIS — M818 Other osteoporosis without current pathological fracture: Secondary | ICD-10-CM | POA: Diagnosis not present

## 2020-10-05 DIAGNOSIS — M545 Low back pain, unspecified: Secondary | ICD-10-CM

## 2020-10-05 DIAGNOSIS — M5442 Lumbago with sciatica, left side: Secondary | ICD-10-CM

## 2020-10-05 MED ORDER — PREDNISONE 20 MG PO TABS: 20.0000 mg | ORAL_TABLET | Freq: Every day | ORAL | 0 refills | Status: AC

## 2020-10-05 MED ORDER — KETOROLAC TROMETHAMINE 30 MG/ML IJ SOLN
15.0000 mg | Freq: Once | INTRAMUSCULAR | Status: AC
  Administered 2020-10-05: 15 mg via INTRAMUSCULAR

## 2020-10-05 NOTE — ED Triage Notes (Signed)
Pt c/o lower back pain radiating lt leg x2 days, denies know injury. Requesting x-rays. C/o lt leg numbness. Denies loss of bowel/bladder.

## 2020-10-05 NOTE — Discharge Instructions (Addendum)
We gave you 15 mg of Toradol Begin prednisone 20 mg daily for 1 week with food in the morning Please follow-up with primary care/spine doctor as you are able Please follow-up if any symptoms worsening/changing

## 2020-10-05 NOTE — ED Provider Notes (Signed)
[facility]    CSN: [id number] Arrival date & time: 10/05/20  1924      History   Chief Complaint Chief Complaint  Patient presents with  . Back Pain    HPI Jade King is a 60 y.o. female history of scoliosis, osteoporosis, degenerative disc disease, hypertension, breast cancer presenting today for evaluation of back pain.  Reports left lower back pain for approximately 1 week.  Denies any specific injury or trauma, but reports significant history of back problems.  Concerned about possible compression fracture.  Has had a tightness around proximal thigh and numbness extending into left leg lower leg and foot.  Denies any saddle anesthesia.  Denies loss of bowel/bladder.   HPI  Past Medical History:  Diagnosis Date  . Anxiety   . Arthritis   . Cancer ([place]) 08/10/2007   Breast cancer  . Hypertension   . PTSD (post-traumatic stress disorder)   . Scoliosis     Patient Active Problem List   Diagnosis Date Noted  . Hirsutism 01/28/2018  . Hyperthyroidism 12/01/2017    Past Surgical History:  Procedure Laterality Date  . ABDOMINAL HYSTERECTOMY    . MASTECTOMY  2009  . TOTAL ABDOMINAL HYSTERECTOMY W/ BILATERAL SALPINGOOPHORECTOMY  2009    OB History   No obstetric history on file.      Home Medications    Prior to Admission medications   Medication Sig Start Date End Date Taking? Authorizing Provider  predniSONE (DELTASONE) 20 MG tablet Take 1 tablet (20 mg total) by mouth daily with breakfast for 7 days. 10/05/20 10/12/20 Yes Kyndra Condron C, PA-C  methimazole (TAPAZOLE) 5 MG tablet TAKE 1 TABLET(5 MG) BY MOUTH DAILY 01/04/19   Renato Shin, MD  metoprolol tartrate (LOPRESSOR) 100 MG tablet Take 100 mg by mouth 2 (two) times daily.    [provider]  RESTASIS 0.05 % ophthalmic emulsion  11/01/17   [provider]  triamcinolone cream (KENALOG) 0.1 % Apply 1 application topically 2 (two) times daily. 02/28/20   Malakhi Markwood, Elesa Hacker, PA-C     Family History Family History  Problem Relation Age of Onset  . Thyroid nodules Mother   . Thyroid disease Mother     Social History Social History   Tobacco Use  . Smoking status: Current Every Day Smoker    Types: Cigarettes  . Smokeless tobacco: Never Used  Substance Use Topics  . Alcohol use: Never  . Drug use: Never     Allergies   Latex, Other, and Penicillins   Review of Systems Review of Systems  Constitutional: Negative for fatigue and fever.  Eyes: Negative for visual disturbance.  Respiratory: Negative for shortness of breath.   Cardiovascular: Negative for chest pain.  Gastrointestinal: Negative for abdominal pain, nausea and vomiting.  Musculoskeletal: Positive for back pain and myalgias. Negative for arthralgias and joint swelling.  Skin: Negative for color change, rash and wound.  Neurological: Negative for dizziness, weakness, light-headedness and headaches.     Physical Exam Triage Vital Signs ED Triage Vitals  Enc Vitals Group     BP 10/05/20 1933 (!) 148/89     Pulse Rate 10/05/20 1933 78     Resp 10/05/20 1933 20     Temp 10/05/20 1933 98.6 F (37 C)     Temp Source 10/05/20 1933 Oral     SpO2 10/05/20 1933 96 %     Weight --      Height --  Head Circumference --      Peak Flow --      Pain Score 10/05/20 1937 10     Pain Loc --      Pain Edu? --      Excl. in [place]? --    No data found.  Updated Vital Signs BP (!) 148/89 (BP Location: Left Arm)   Pulse 78   Temp 98.6 F (37 C) (Oral)   Resp 20   SpO2 96%   Visual Acuity Right Eye Distance:   Left Eye Distance:   Bilateral Distance:    Right Eye Near:   Left Eye Near:    Bilateral Near:     Physical Exam Vitals and nursing note reviewed.  Constitutional:      Appearance: She is well-developed.     Comments: No acute distress  HENT:     Head: Normocephalic and atraumatic.     Nose: Nose normal.  Eyes:     Conjunctiva/sclera: Conjunctivae normal.   Cardiovascular:     Rate and Rhythm: Normal rate.  Pulmonary:     Effort: Pulmonary effort is normal. No respiratory distress.  Abdominal:     General: There is no distension.  Musculoskeletal:        General: Normal range of motion.     Cervical back: Neck supple.     Comments: Back: Obvious scoliosis noted throughout thoracic spine, mild lower lumbar spine tenderness extending to left paraspinal area  Strength in hips and knees 5/5 ankle bilaterally, patellar reflex 2+ bilaterally  Skin:    General: Skin is warm and dry.  Neurological:     Mental Status: She is alert and oriented to person, place, and time.      UC Treatments / Results  Labs (all labs ordered are listed, but only abnormal results are displayed) Labs Reviewed - No data to display  EKG   Radiology DG Lumbar Spine Complete  Result Date: 10/05/2020 CLINICAL DATA:  Lower lumbar pain. X 1 week. Osteoporosis and scoliosis. Degenerative changes. Worsening back pain. Concern for compression fracture. EXAM: LUMBAR SPINE - COMPLETE 4+ VIEW COMPARISON:  None. FINDINGS: Five non-rib-bearing lumbar vertebral bodies are identified. Levoscoliosis of the thoracolumbar spine. Associated multilevel degenerative changes spine. There is no definite evidence of lumbar spine fracture. Atherosclerotic plaque of the aorta. IMPRESSION: 1. Levoscoliosis of the thoracolumbar spine with no definite acute displaced fracture or traumatic listhesis. 2.  Aortic Atherosclerosis (ICD10-I70.0). Electronically Signed   By: Iven Finn M.D.   On: 10/05/2020 20:14    Procedures Procedures (including critical care time)  Medications Ordered in UC Medications  ketorolac (TORADOL) 30 MG/ML injection 15 mg (has no administration in time range)    Initial Impression / Assessment and Plan / UC Course  I have reviewed the triage vital signs and the nursing notes.  Pertinent labs & imaging results that were available during my care of the  patient were reviewed by me and considered in my medical decision making (see chart for details).     X-ray showing scoliosis, no signs of acute fracture.  Symptoms suggestive of sciatica, no neuro deficits or red flags for cauda equina.  With discussion with patient opted to proceed with Toradol 15 mg prior to discharge and continuing on prednisone 20 mg x 5 to 7 days as tolerating.  Take with food.  Patient to follow-up with regular spine doctor for further treatment/evaluation.   Discussed strict return precautions. Patient verbalized understanding and is agreeable with plan.  Final Clinical Impressions(s) / UC Diagnoses   Final diagnoses:  Acute left-sided low back pain with left-sided sciatica     Discharge Instructions     We gave you 15 mg of Toradol Begin prednisone 20 mg daily for 1 week with food in the morning Please follow-up with primary care/spine doctor as you are able Please follow-up if any symptoms worsening/changing    ED Prescriptions    Medication Sig Dispense Auth. Provider   predniSONE (DELTASONE) 20 MG tablet Take 1 tablet (20 mg total) by mouth daily with breakfast for 7 days. 7 tablet Roya Gieselman, [place] C, PA-C     PDMP not reviewed this encounter.   Janith Lima, [place] 10/05/20 2032

## 2021-08-09 IMAGING — DX DG LUMBAR SPINE COMPLETE 4+V
5 series · 5 of 5 positions shown · non-contrast
Comparison: None.

CLINICAL DATA: Lower lumbar pain. X 1 week. Osteoporosis and
scoliosis. Degenerative changes. Worsening back pain. Concern for
compression fracture.

EXAM:
LUMBAR SPINE - COMPLETE 4+ VIEW

[lumbar spine ap]
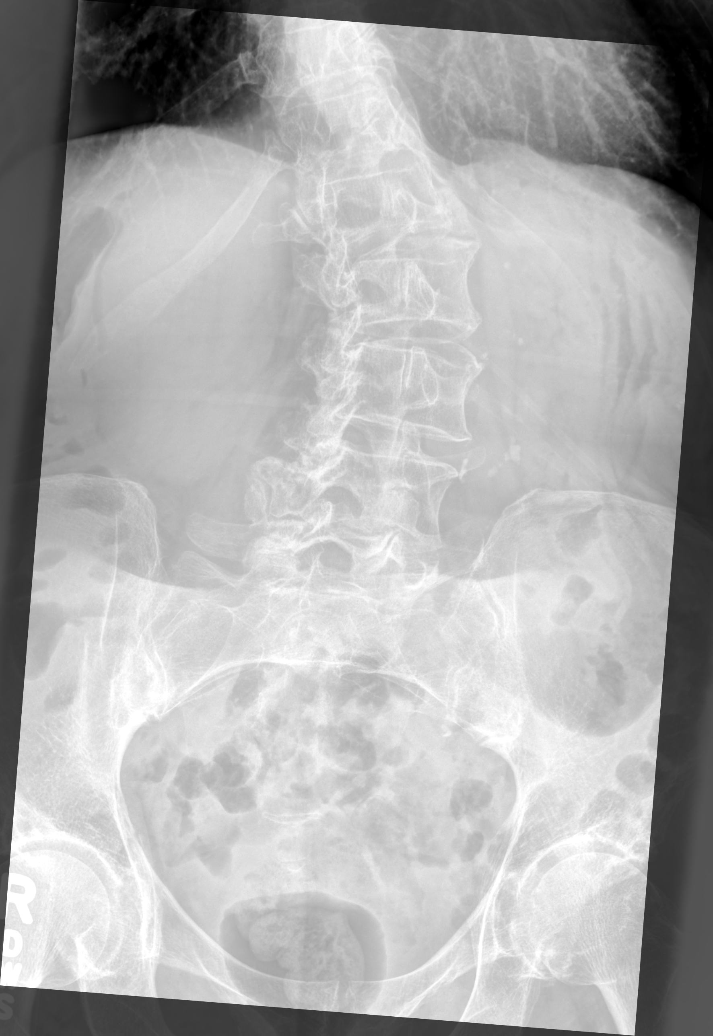

[lumbar spine oblique supine (1 of 2)]
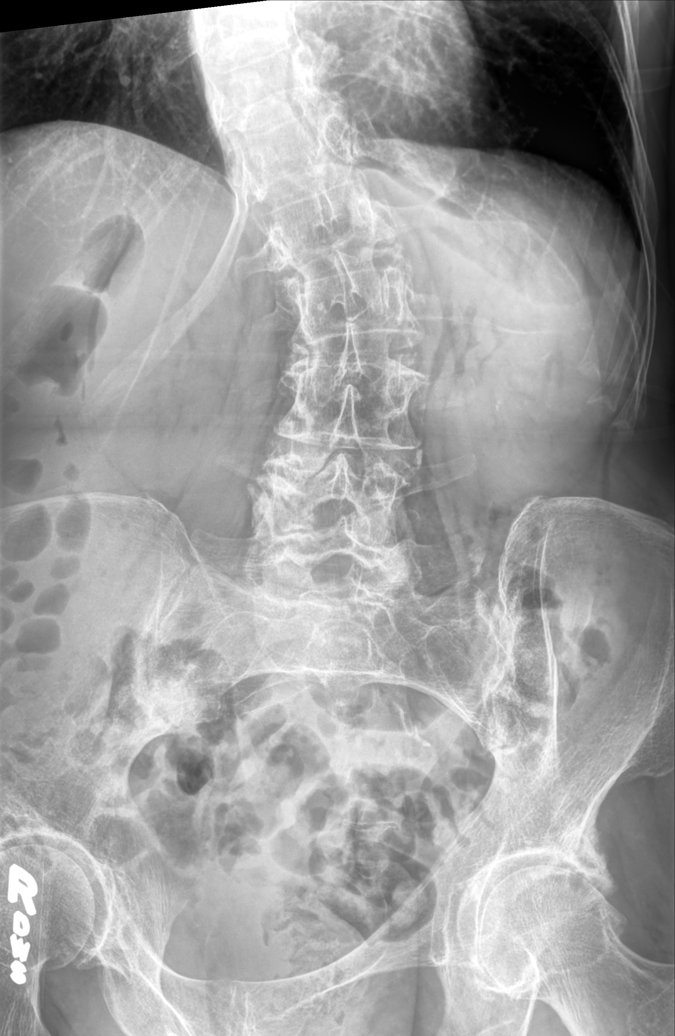

[lumbar spine oblique supine (2 of 2)]
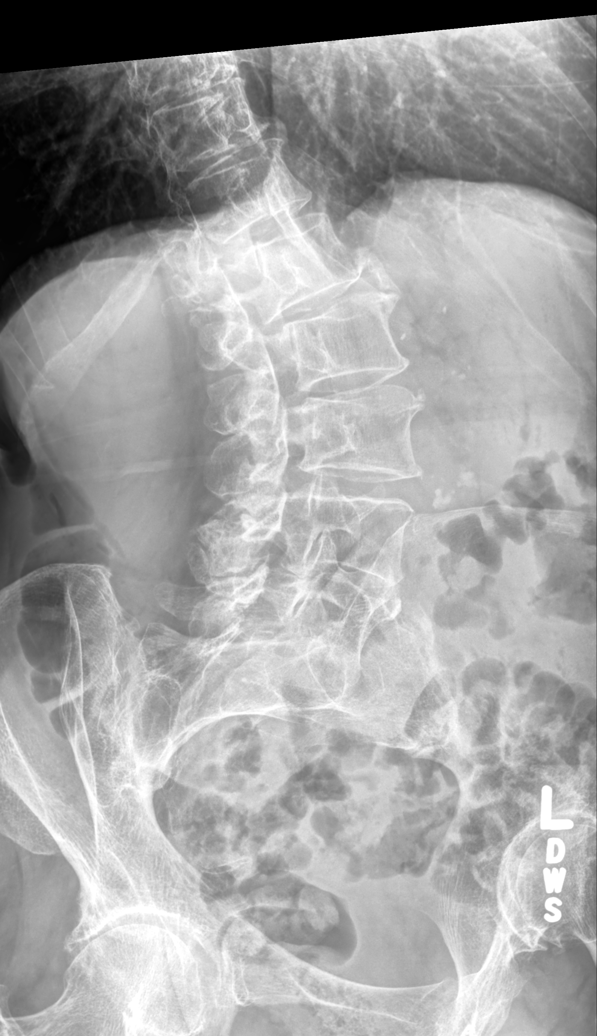

[lumbar spine lat (1 of 2)]
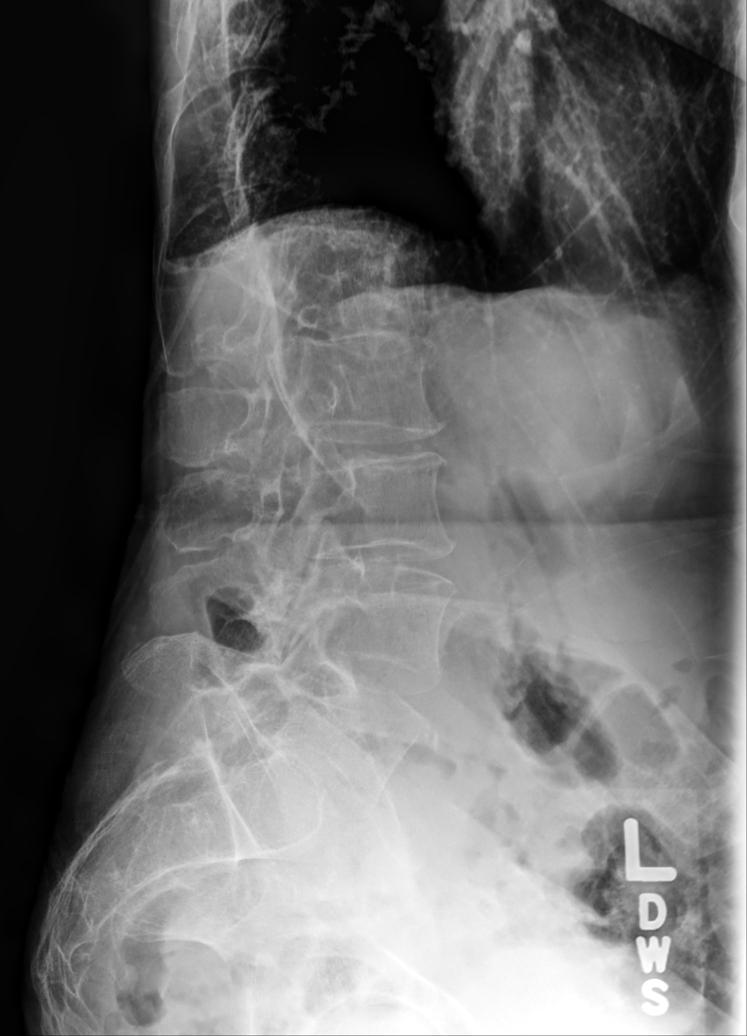

[lumbar spine lat (2 of 2)]
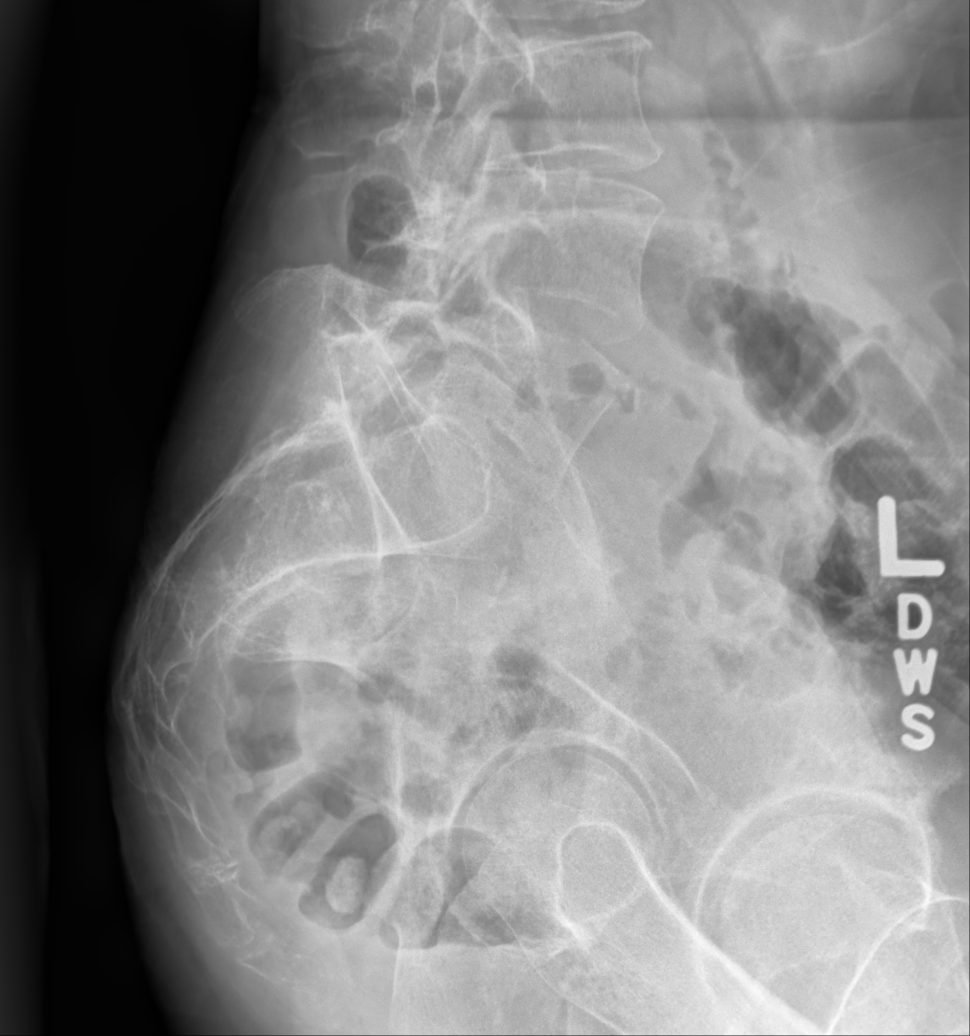

[5 of 5 positions shown; findings below may reference images not displayed]

FINDINGS: Five non-rib-bearing lumbar vertebral bodies are identified.
Levoscoliosis of the thoracolumbar spine. Associated multilevel
degenerative changes spine. There is no definite evidence of lumbar
spine fracture. Atherosclerotic plaque of the aorta.
IMPRESSION: 1. Levoscoliosis of the thoracolumbar spine with no definite acute
displaced fracture or traumatic listhesis.
2.  Aortic Atherosclerosis ([id number]).
